# Patient Record
Sex: Female | Born: 1975 | Race: White | Hispanic: Yes | Marital: Married
Health system: Southern US, Community
[De-identification: ages and names within clinical notes are randomized; demographics above are authoritative.]

---

## 2007-09-26 ENCOUNTER — Emergency Department (HOSPITAL_COMMUNITY): Admission: EM | Admit: 2007-09-26 | Discharge: 2007-09-26 | Payer: Self-pay | Admitting: Emergency Medicine

## 2007-10-12 ENCOUNTER — Emergency Department (HOSPITAL_COMMUNITY): Admission: EM | Admit: 2007-10-12 | Discharge: 2007-10-12 | Payer: Self-pay | Admitting: Emergency Medicine

## 2009-05-13 IMAGING — CR DG CERVICAL SPINE COMPLETE 4+V
5 series · 5 of 5 positions shown · non-contrast
Comparison: None

CLINICAL DATA: Fall and neck pain.

CERVICAL SPINE - COMPLETE 4+ VIEW

[w c-spine lat]
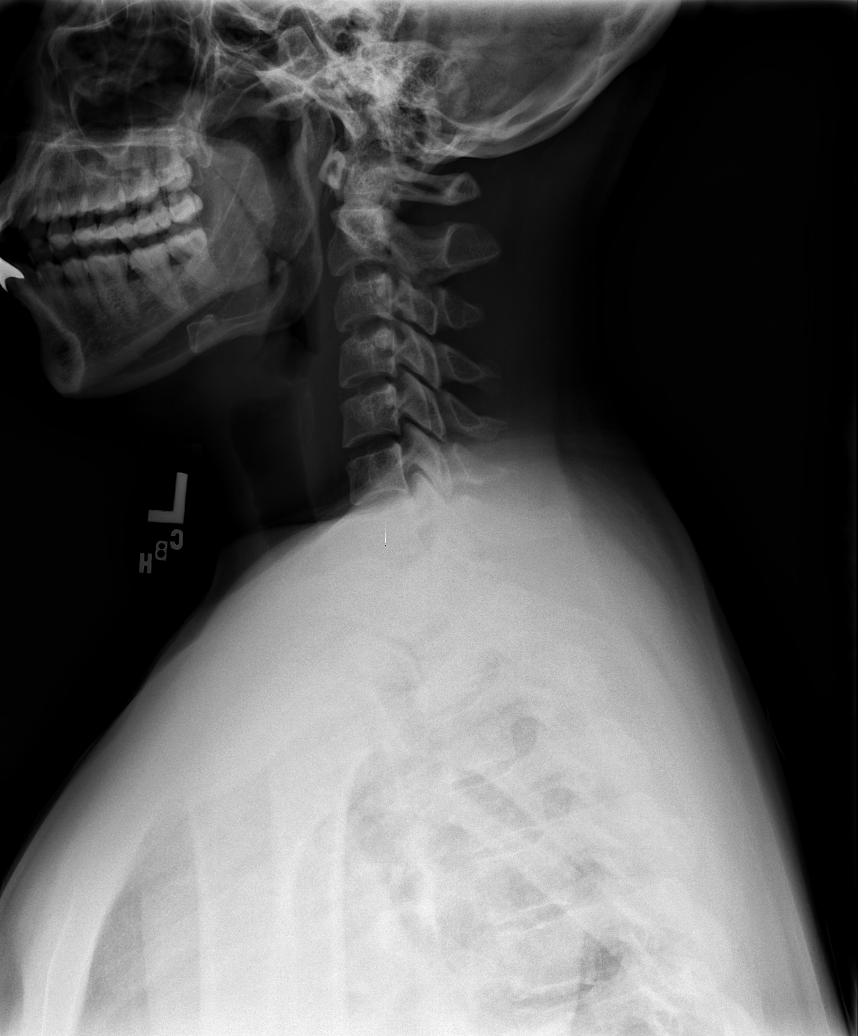

[w c-spine oblique (1 of 2)]
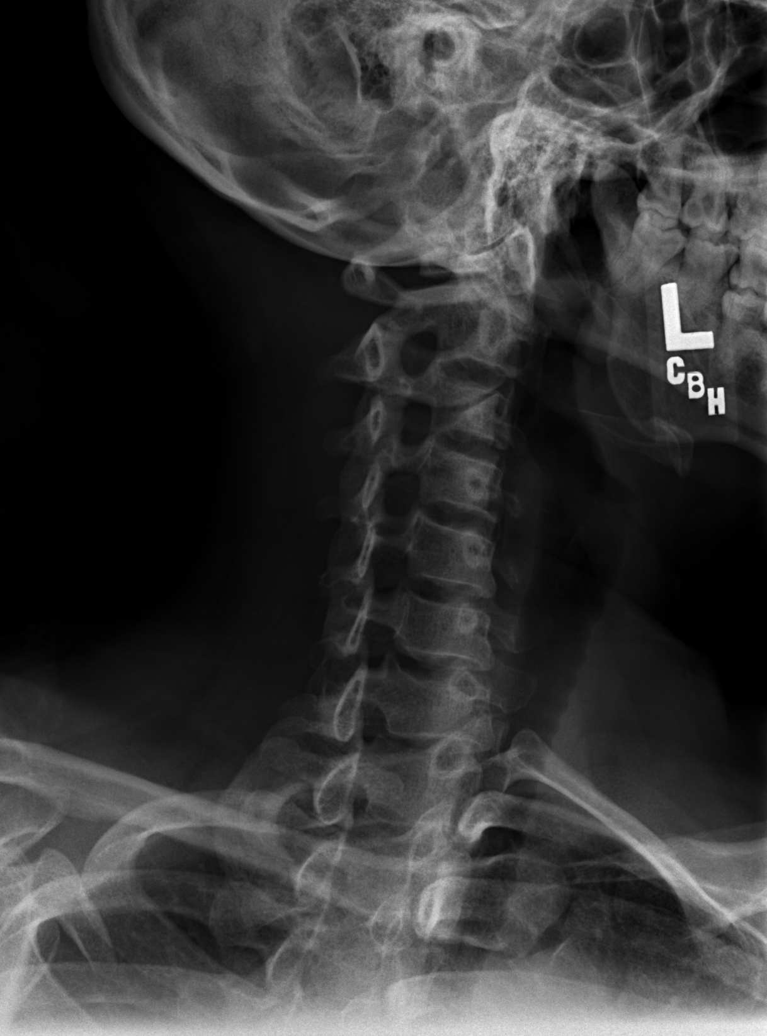

[w c-spine oblique (2 of 2)]
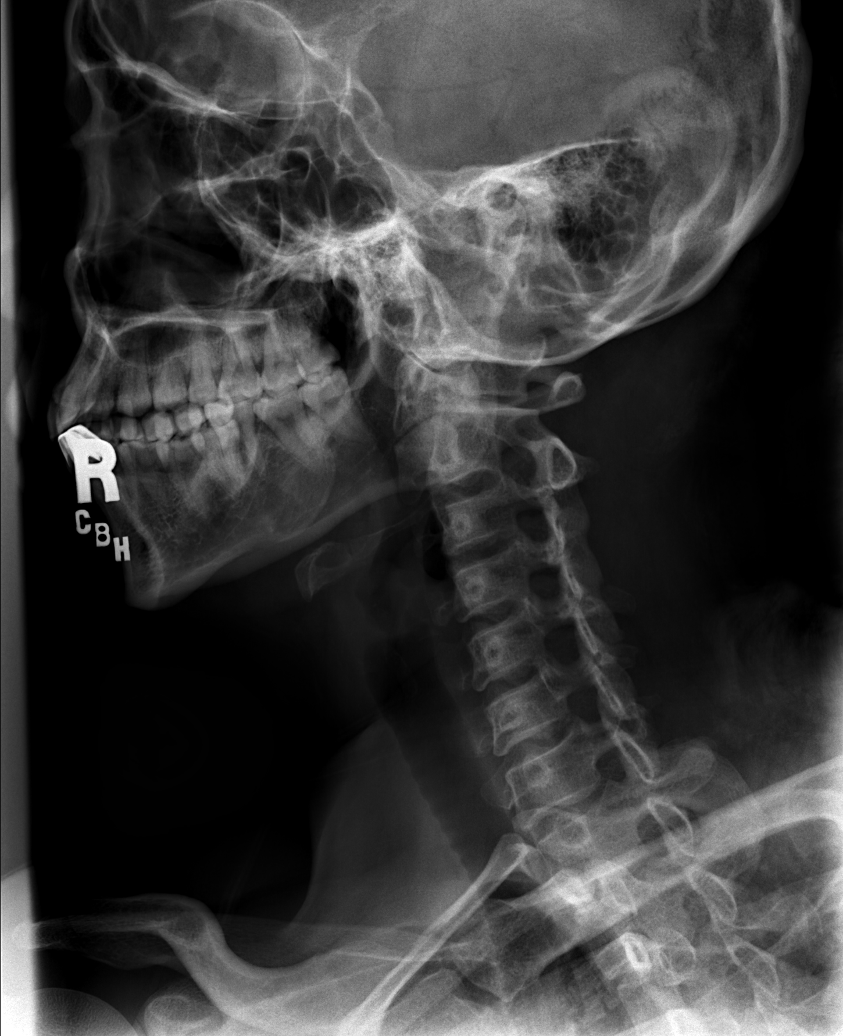

[w c-spine a.p.]
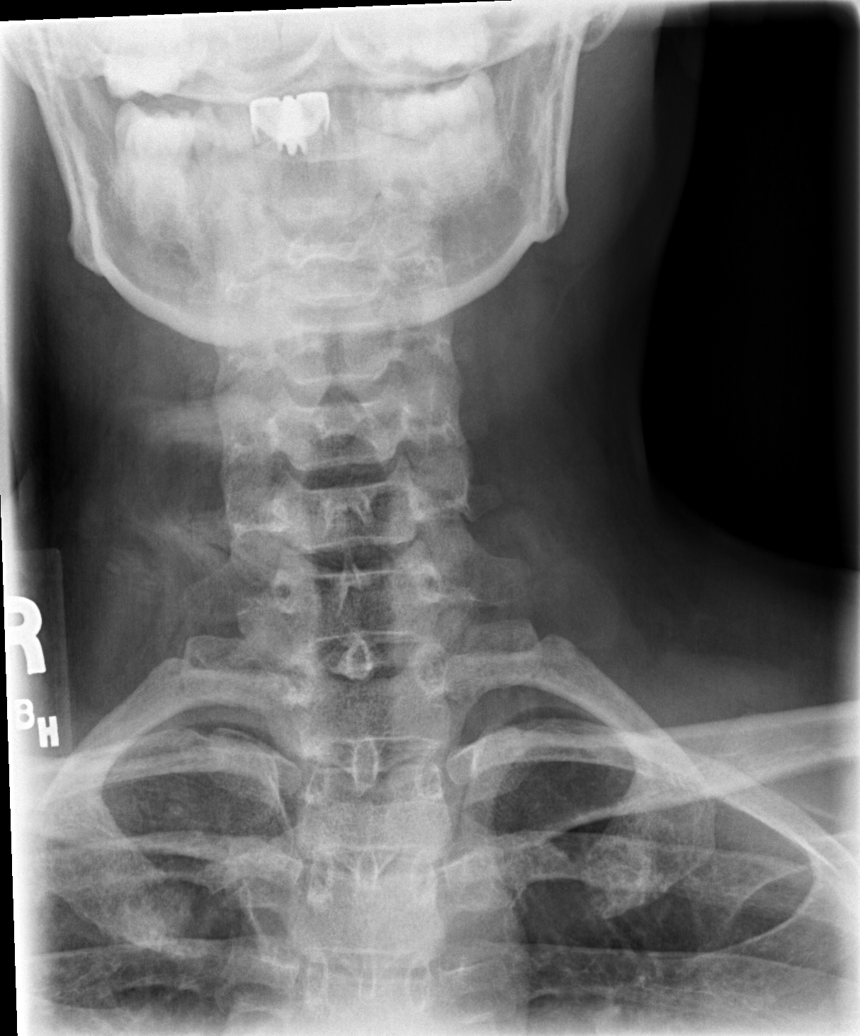

[w c-spine odontoid]
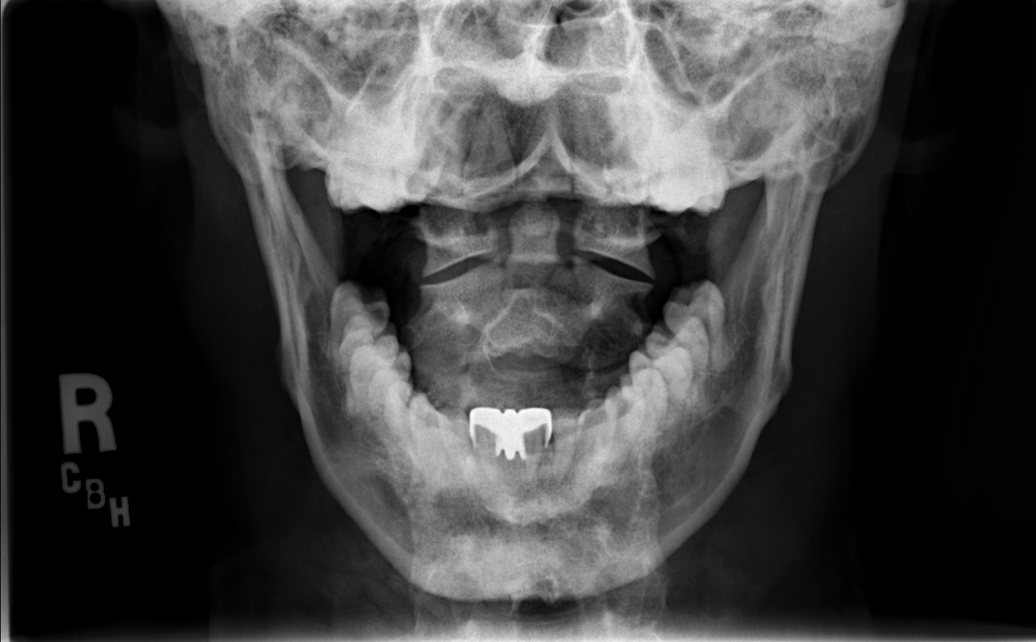

[5 of 5 positions shown; findings below may reference images not displayed]

FINDINGS: AP, oblique, lateral and odontoid view of the cervical
spine were obtained.  There is normal alignment of the cervical
spine.  The cervicothoracic junction is difficult to visualize on
the lateral view but has a normal appearance on the oblique images.
The neural foramen are patent.  There are no acute fractures or
dislocations. Normal alignment of the cervicothoracic junction on
the swimmer's view.
IMPRESSION: No acute bony abnormalities to the cervical spine.

## 2009-05-13 IMAGING — CR DG SHOULDER 2+V*L*
3 series · 3 of 3 positions shown · non-contrast
Comparison: None

CLINICAL DATA: Fall and pain

LEFT SHOULDER - 2+ VIEW

[w shoulder ap internal left]
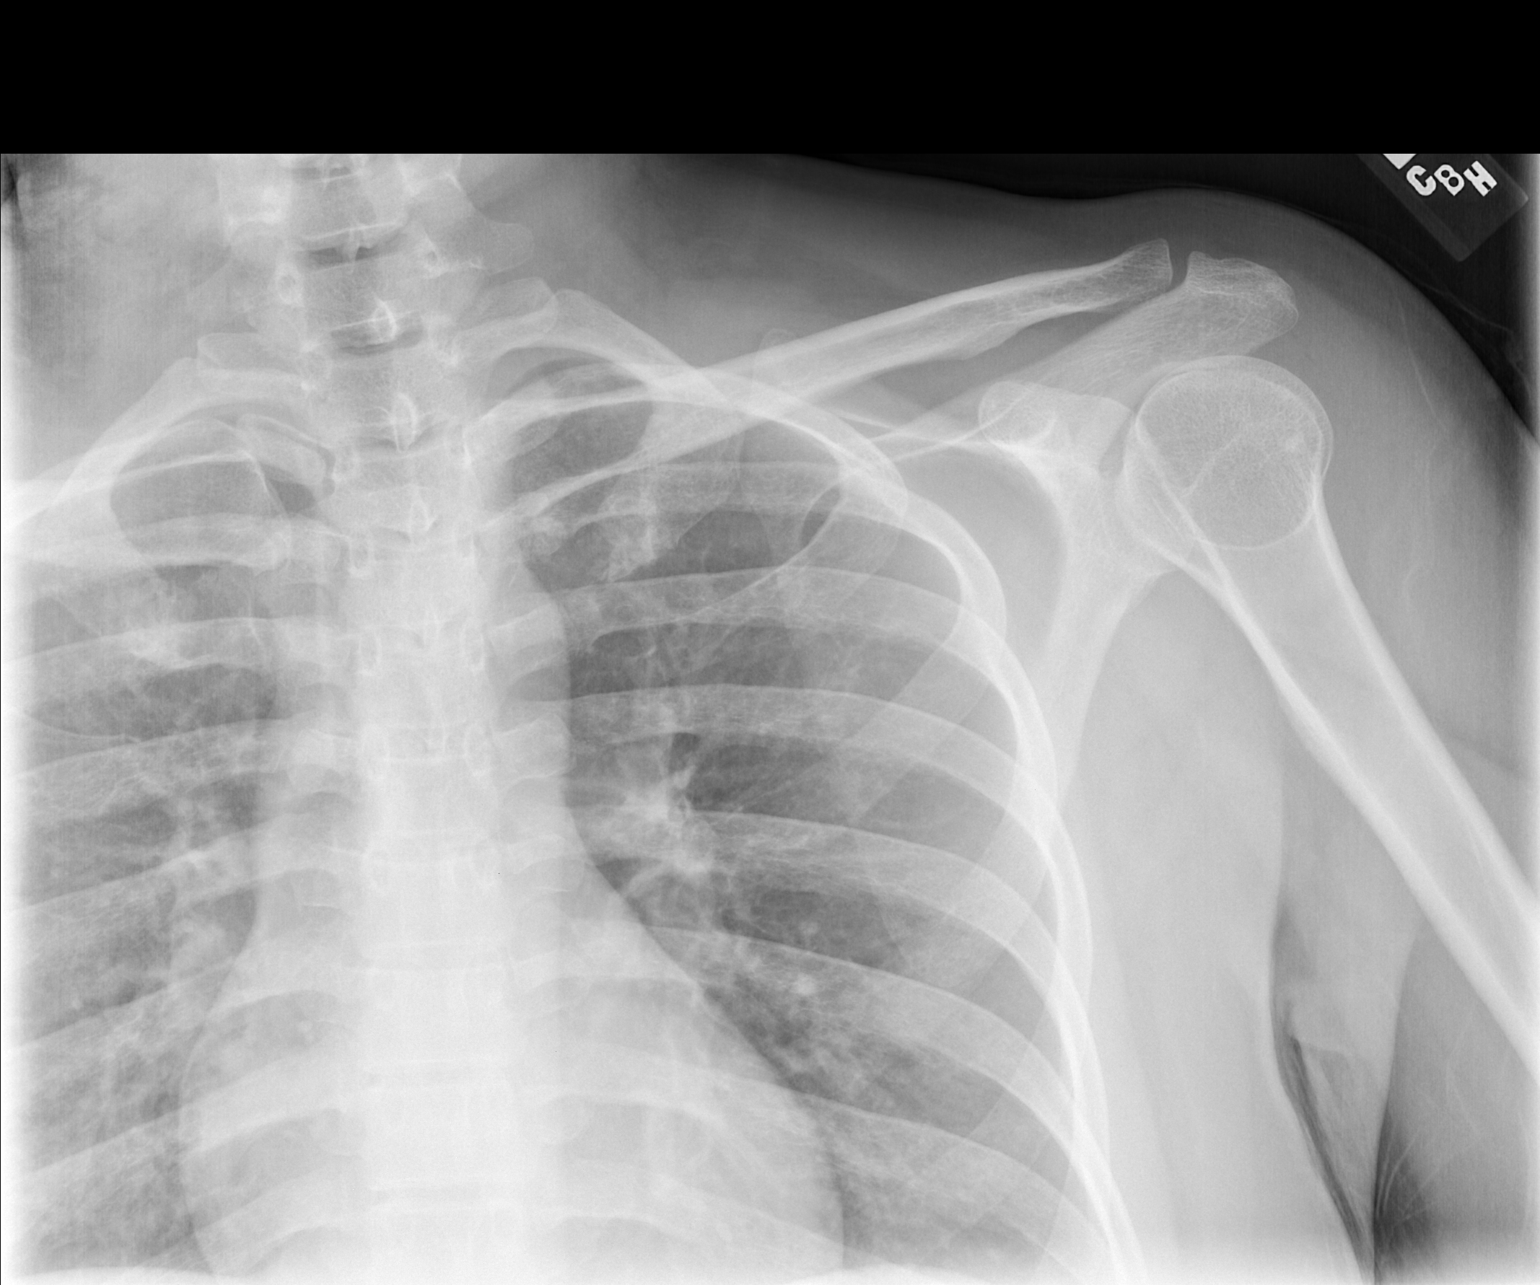

[w shoulder ap external left]
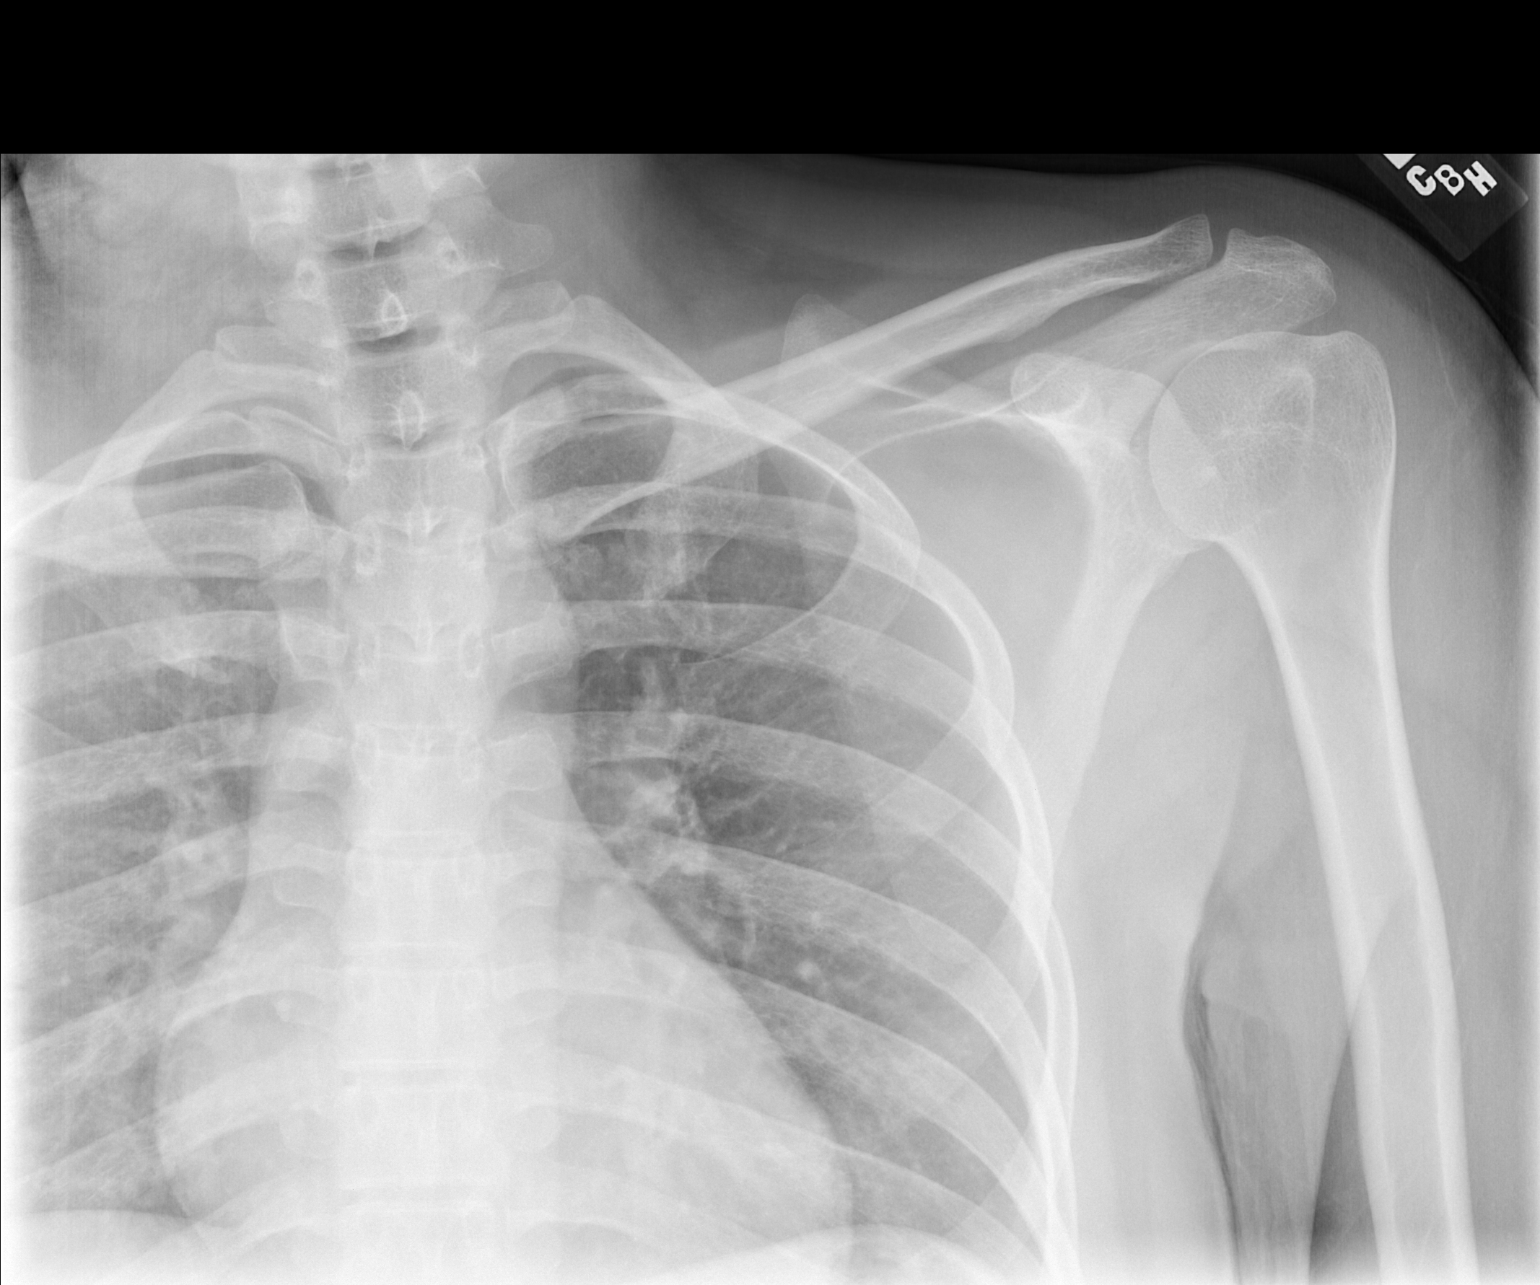

[w shoulder y view left]
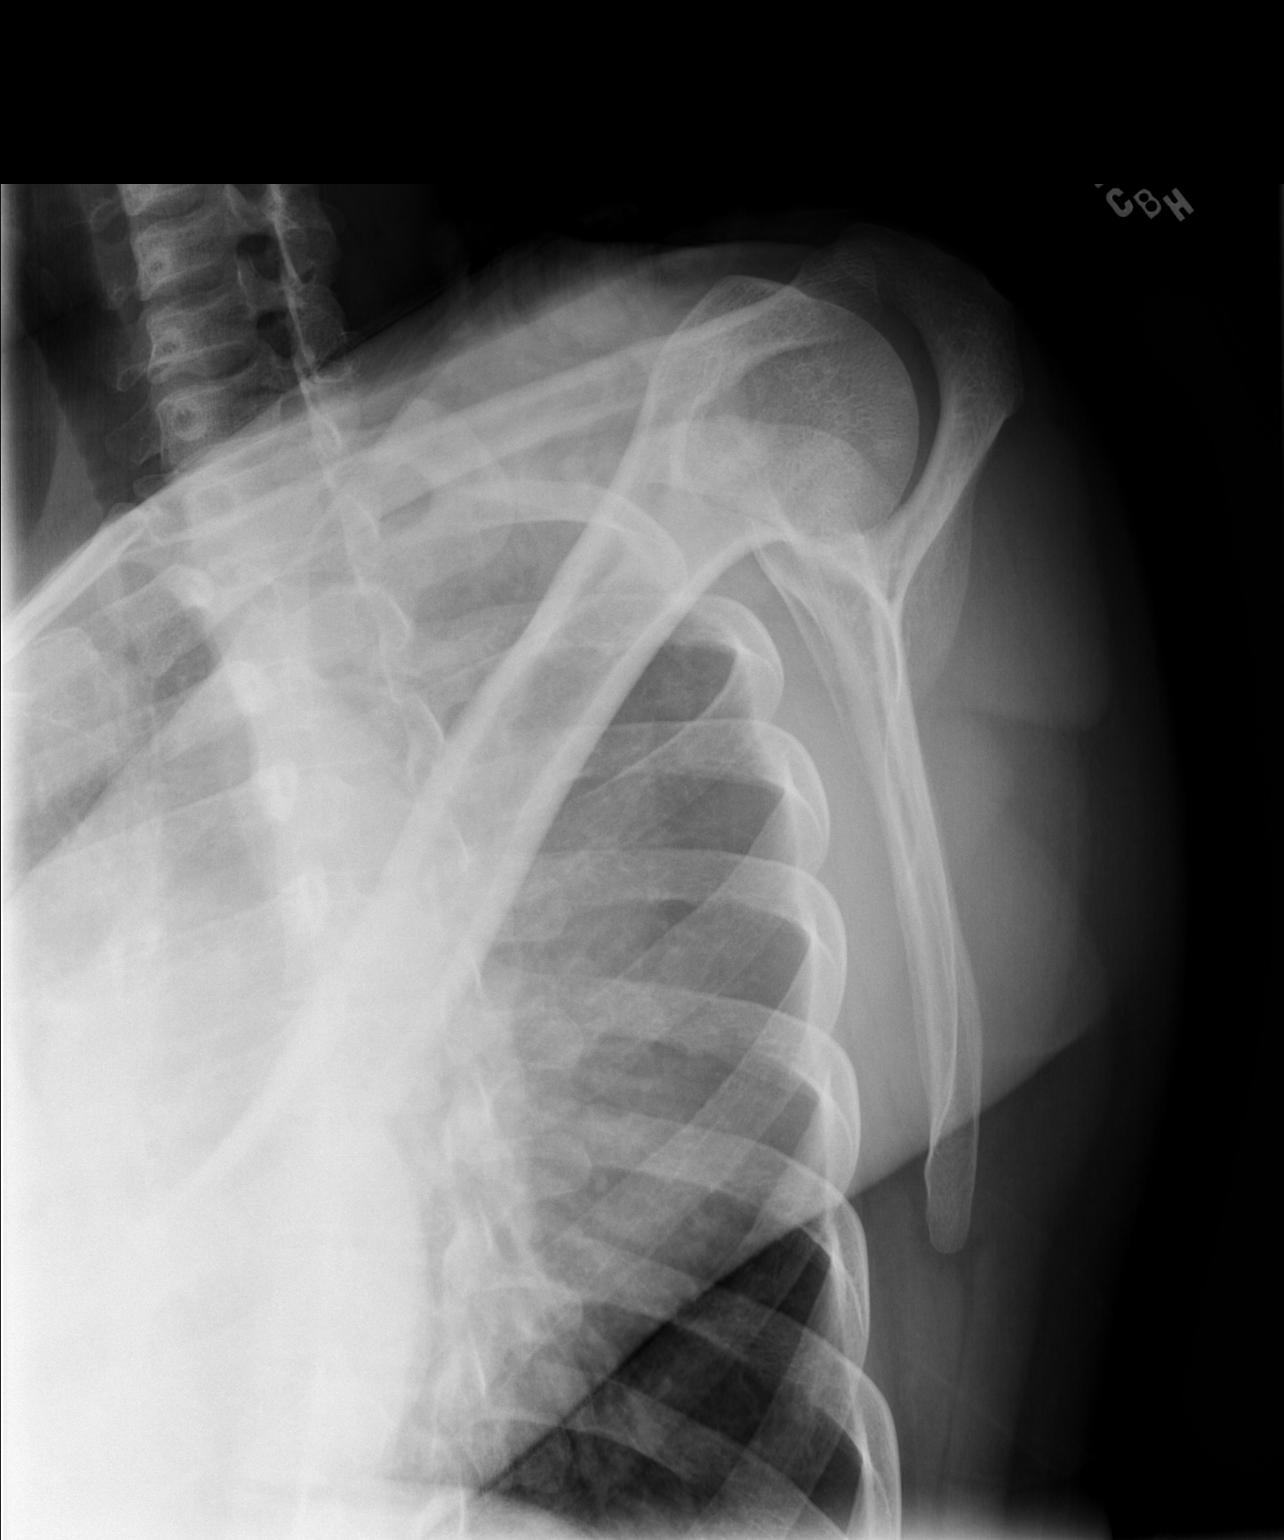

[3 of 3 positions shown; findings below may reference images not displayed]

FINDINGS: Three views of the left shoulder are negative for acute
fracture or dislocation.  The left lung is clear without
pneumothorax.
IMPRESSION: Negative radiographs of the left shoulder.

## 2017-05-30 ENCOUNTER — Encounter: Payer: Self-pay | Admitting: Obstetrics and Gynecology

## 2017-06-07 ENCOUNTER — Encounter: Payer: Self-pay | Admitting: *Deleted

## 2017-06-28 ENCOUNTER — Encounter: Payer: Self-pay | Admitting: Obstetrics and Gynecology

## 2017-06-29 ENCOUNTER — Encounter: Payer: Self-pay | Admitting: Obstetrics and Gynecology

## 2017-06-29 NOTE — Progress Notes (Signed)
Patient did not keep GYN referral appointment for 06/28/2017.  Cornelia Copaharlie Tiyah Zelenak, Jr MD [facility]ending [facility]ulty [facility])
